# Patient Record
Sex: Male | Born: 1970 | Race: White | Hispanic: No | Marital: Single | Smoking: Former smoker
Health system: Southern US, Community
[De-identification: ages and names within clinical notes are randomized; demographics above are authoritative.]

## PROBLEM LIST (undated history)

## (undated) HISTORY — PX: ANAL FISSURE REPAIR: SHX2312

---

## 2008-09-26 ENCOUNTER — Ambulatory Visit: Payer: Self-pay | Admitting: Family Medicine

## 2016-06-10 ENCOUNTER — Ambulatory Visit
Admission: EM | Admit: 2016-06-10 | Discharge: 2016-06-10 | Disposition: A | Discharge status: Home / Self Care | Payer: 59 | Attending: Family Medicine | Admitting: Family Medicine

## 2016-06-10 ENCOUNTER — Ambulatory Visit (INDEPENDENT_AMBULATORY_CARE_PROVIDER_SITE_OTHER): Payer: 59

## 2016-06-10 ENCOUNTER — Encounter: Payer: Self-pay | Admitting: *Deleted

## 2016-06-10 DIAGNOSIS — S6991XA Unspecified injury of right wrist, hand and finger(s), initial encounter: Secondary | ICD-10-CM

## 2016-06-10 MED ORDER — MELOXICAM 15 MG PO TABS: 15.0000 mg | ORAL_TABLET | Freq: Every day | ORAL | 0 refills | Status: DC

## 2016-06-10 NOTE — ED Provider Notes (Signed)
[facility]    CSN: [id number] Arrival date & time: 06/10/16  1138  First Provider Contact:  First MD Initiated Contact with Patient 06/10/16 1224        History   Chief Complaint Chief Complaint  Patient presents with  . Finger Injury    HPI Ronald Ibarra is a 45 y.o. male.   Patient's here because of injury to his right hand. Reports about 3 weeks ago he was doing some cutting down corn stalks. He states that he was using his right hand thinks he may use his hand and properly start having pain in his right hand. The pain is over the first medical carpal bone. He denies any pain over the phalangeal bones.  He denies any chronic medical medical problems. Denies any chronic medications. Denies any drug allergies. No pertinent family medical history significant for today's visit.   The history is provided by the patient. No language interpreter was used.  Hand Pain  This is a new problem. The current episode started more than 1 week ago. The problem occurs constantly. The problem has not changed since onset.Pertinent negatives include no chest pain, no abdominal pain, no headaches and no shortness of breath. The symptoms are aggravated by bending and twisting. Nothing relieves the symptoms. He has tried nothing for the symptoms. The treatment provided no relief.    History reviewed. No pertinent past medical history.  There are no active problems to display for this patient.   Past Surgical History:  Procedure Laterality Date  . ANAL FISSURE REPAIR         Home Medications    Prior to Admission medications   Medication Sig Start Date End Date Taking? Authorizing Provider  meloxicam (MOBIC) 15 MG tablet Take 1 tablet (15 mg total) by mouth daily. 06/10/16   Hassan RowanEugene Kaled Allende, MD    Family History History reviewed. No pertinent family history.  Social History Social History  Substance Use Topics  . Smoking status: Former Games developermoker  . Smokeless tobacco: Never  Used  . Alcohol use Yes     Allergies   Review of patient's allergies indicates no known allergies.   Review of Systems Review of Systems  Respiratory: Negative for shortness of breath.   Cardiovascular: Negative for chest pain.  Gastrointestinal: Negative for abdominal pain.  Musculoskeletal: Positive for myalgias.  Neurological: Negative for headaches.  All other systems reviewed and are negative.    Physical Exam Triage Vital Signs ED Triage Vitals  Enc Vitals Group     BP 06/10/16 1214 118/80     Pulse Rate 06/10/16 1214 (!) 55     Resp 06/10/16 1214 16     Temp 06/10/16 1214 98.7 F (37.1 C)     Temp Source 06/10/16 1214 Oral     SpO2 06/10/16 1214 99 %     Weight 06/10/16 1215 183 lb (83 kg)     Height 06/10/16 1215 5\' 11"  (1.803 m)     Head Circumference --      Peak Flow --      Pain Score --      Pain Loc --      Pain Edu? --      Excl. in GC? --    No data found.   Updated Vital Signs BP 118/80 (BP Location: Left Arm)   Pulse (!) 55   Temp 98.7 F (37.1 C) (Oral)   Resp 16   Ht 5\' 11"  (1.803 m)   Wt  183 lb (83 kg)   SpO2 99%   BMI 25.52 kg/m   Visual Acuity Right Eye Distance:   Left Eye Distance:   Bilateral Distance:    Right Eye Near:   Left Eye Near:    Bilateral Near:     Physical Exam  Constitutional: He appears well-developed and well-nourished.  HENT:  Head: Normocephalic and atraumatic.  Eyes: Pupils are equal, round, and reactive to light.  Pulmonary/Chest: Effort normal.  Musculoskeletal: He exhibits tenderness. He exhibits no edema or deformity.       Right hand: He exhibits tenderness.       Hands: Patient is tenderness over the second metacarpal bone in his right hand no signs of obvious fracture of the 2nd metacarpel.  Neurological: He is alert.  Skin: Skin is warm.  Psychiatric: He has a normal mood and affect.     UC Treatments / Results  Labs (all labs ordered are listed, but only abnormal results are  displayed) Labs Reviewed - No data to display  EKG  EKG Interpretation None       Radiology Dg Hand Complete Right  Result Date: 06/10/2016 CLINICAL DATA:  Possible injury right index finger 3 weeks ago while working and garden with hyperflexion. Pain is at the MCP joint. EXAM: RIGHT HAND - COMPLETE 3+ VIEW COMPARISON:  None. FINDINGS: There is no evidence of fracture or dislocation. There is no evidence of arthropathy or other focal bone abnormality. Soft tissues are unremarkable. IMPRESSION: Negative. Electronically Signed   By: Kennith Center M.D.   On: 06/10/2016 13:18    Procedures Procedures (including critical care time)  Medications Ordered in UC Medications - No data to display   Initial Impression / Assessment and Plan / UC Course  I have reviewed the triage vital signs and the nursing notes.  Pertinent labs & imaging results that were available during my care of the patient were reviewed by me and considered in my medical decision making (see chart for details).  Clinical Course    This patient on Mobic 15 mg 1 tablet day if x-rays negative as expected since it's been going on for more than 3 weeks would not worry about splinting the finger at this time. Follow-up with PCP if not better.  Final Clinical Impressions(s) / UC Diagnoses   Final diagnoses:  Hand injury, right, initial encounter    New Prescriptions New Prescriptions   MELOXICAM (MOBIC) 15 MG TABLET    Take 1 tablet (15 mg total) by mouth daily.     Hassan Rowan, MD 06/10/16 1326

## 2016-06-10 NOTE — ED Triage Notes (Signed)
Pt hyperextended right index finger while gardening 3 weeks ago. Right index finger mild edema.

## 2017-11-22 IMAGING — CR DG HAND COMPLETE 3+V*R*
3 series · 3 of 3 positions shown · non-contrast
Comparison: None.

CLINICAL DATA: Possible injury right index finger 3 weeks ago while
working and garden with hyperflexion. Pain is at the MCP joint.

EXAM:
RIGHT HAND - COMPLETE 3+ VIEW

[hand ap]
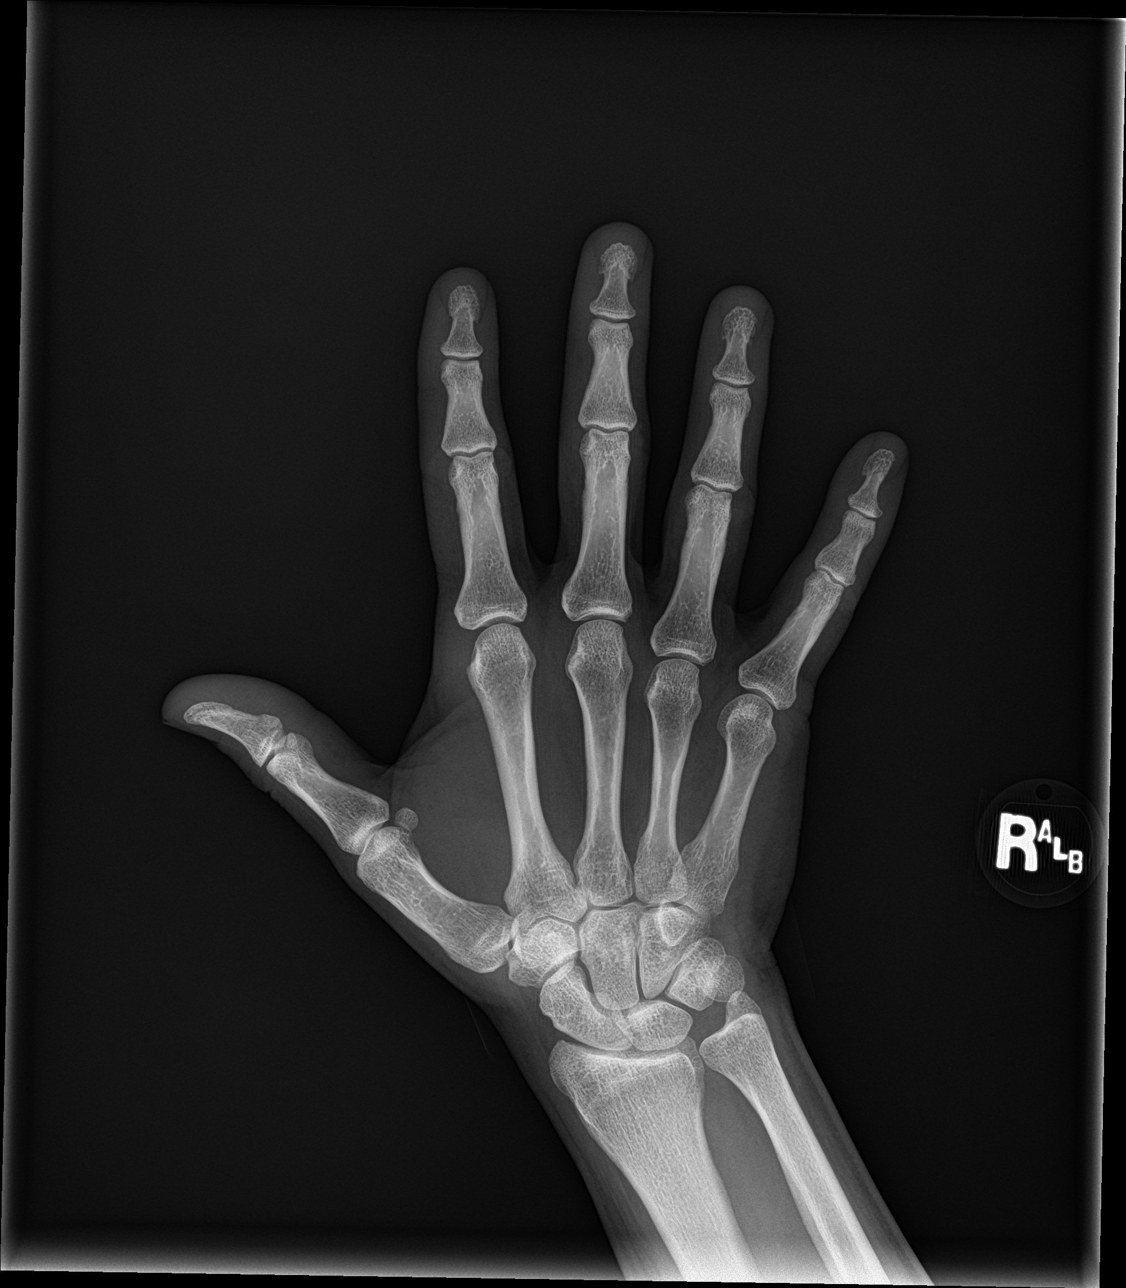

[hand obl]
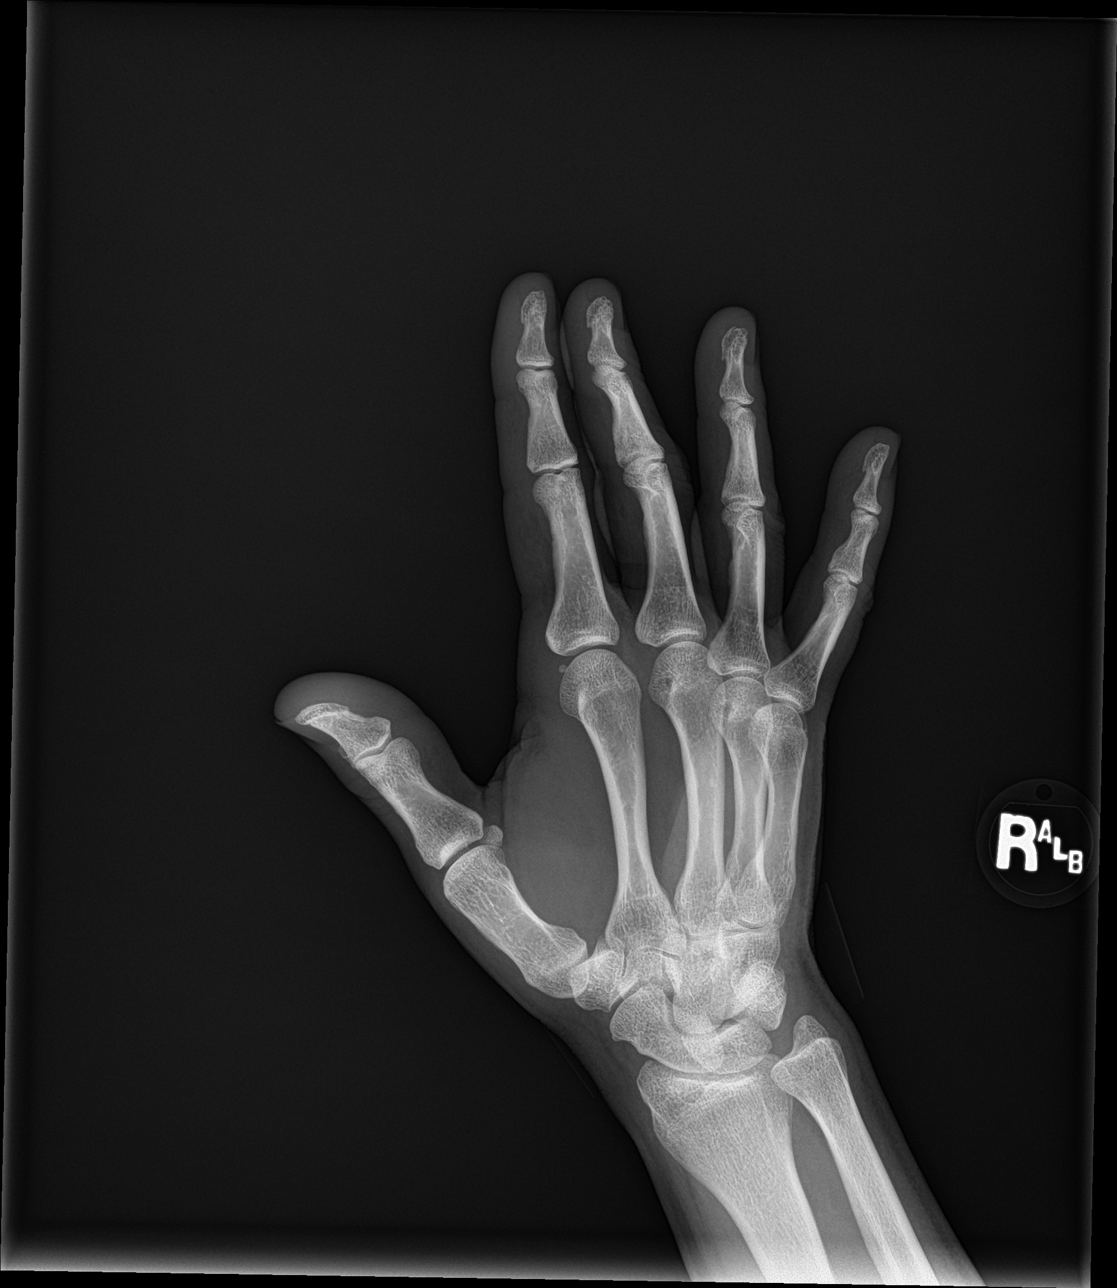

[hand lat]
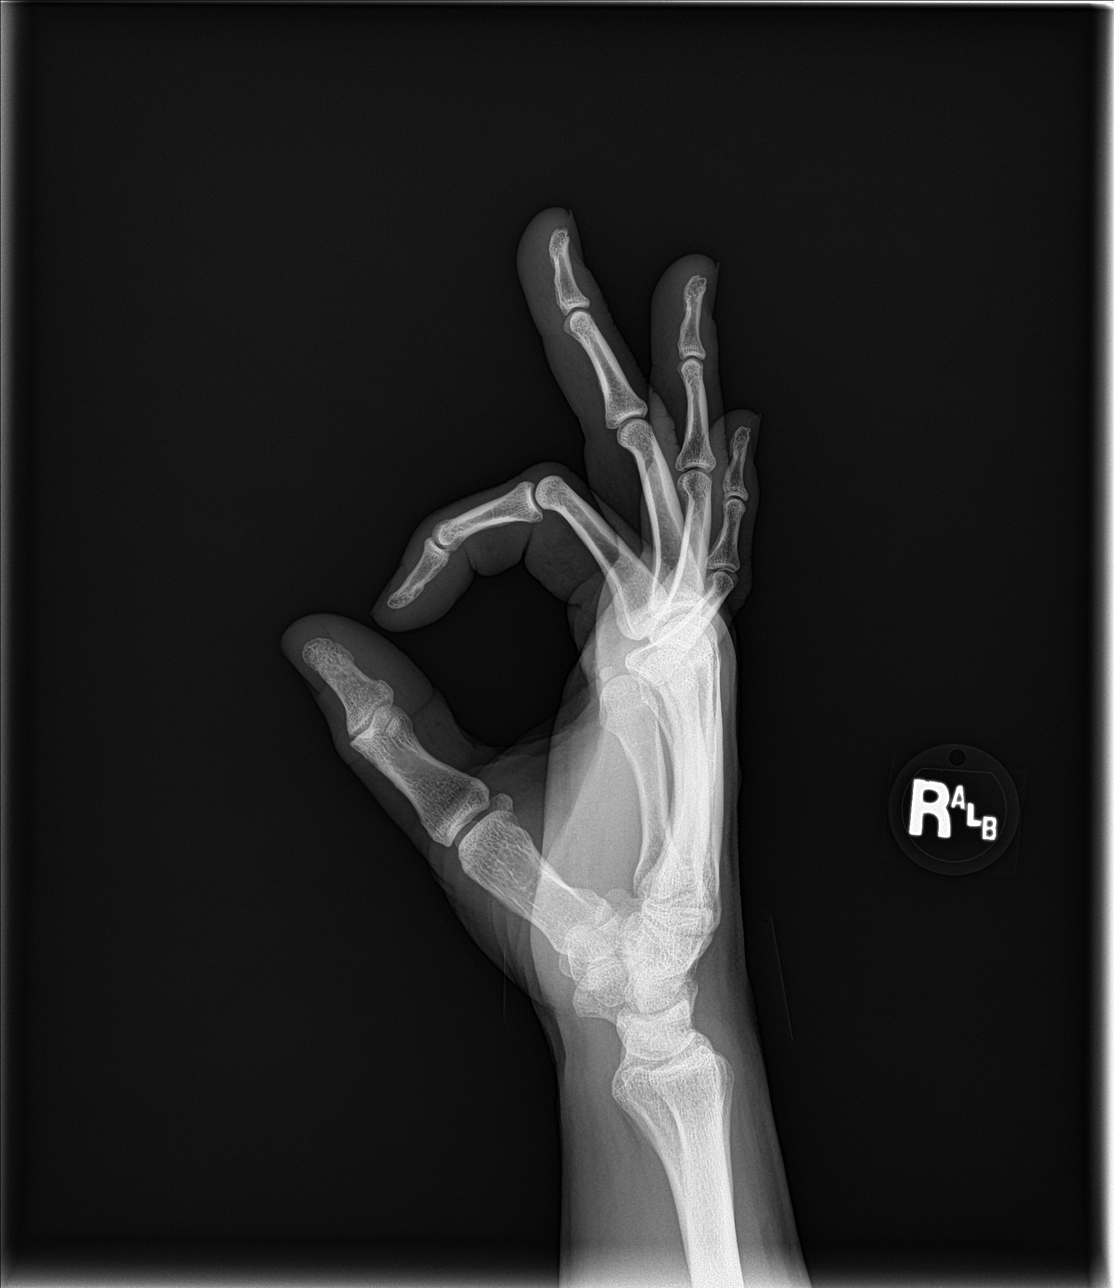

[3 of 3 positions shown; findings below may reference images not displayed]

FINDINGS: There is no evidence of fracture or dislocation. There is no
evidence of arthropathy or other focal bone abnormality. Soft
tissues are unremarkable.
IMPRESSION: Negative.

## 2018-04-01 ENCOUNTER — Ambulatory Visit
Admission: EM | Admit: 2018-04-01 | Discharge: 2018-04-01 | Disposition: A | Discharge status: Home / Self Care | Payer: 59 | Attending: Family Medicine | Admitting: Family Medicine

## 2018-04-01 DIAGNOSIS — J029 Acute pharyngitis, unspecified: Secondary | ICD-10-CM

## 2018-04-01 DIAGNOSIS — B9789 Other viral agents as the cause of diseases classified elsewhere: Secondary | ICD-10-CM

## 2018-04-01 LAB — RAPID STREP SCREEN (MED CTR MEBANE ONLY): STREPTOCOCCUS, GROUP A SCREEN (DIRECT): NEGATIVE

## 2018-04-01 MED ORDER — LIDOCAINE VISCOUS HCL 2 % MT SOLN: OROMUCOSAL | 0 refills | Status: AC

## 2018-04-01 NOTE — ED Triage Notes (Signed)
As per patient pain started on week ago as sore throat but now feels deeper discomfort in throat in Right side.

## 2018-04-01 NOTE — ED Provider Notes (Signed)
[facility]    CSN: [id number] Arrival date & time: 04/01/18  1133  History   Chief Complaint Chief Complaint  Patient presents with  . Sore Throat   HPI  47 year old male presents with sore throat.  Patient reports that he has had sore throat since Friday.  Patient states that it is difficult when he swallows.  He localizes the pain to the right side.  No fever or chills.  No medications or interventions tried.  No known relieving factors.  No other upper respiratory symptoms.  No other complaints.  Social History Social History   Tobacco Use  . Smoking status: Former Games developermoker  . Smokeless tobacco: Never Used  Substance Use Topics  . Alcohol use: Yes  . Drug use: Not on file    Allergies   Patient has no known allergies.   Review of Systems Review of Systems  Constitutional: Negative for fever.  HENT: Positive for sore throat.    Physical Exam Triage Vital Signs ED Triage Vitals  Enc Vitals Group     BP 04/01/18 1147 119/80     Pulse Rate 04/01/18 1147 60     Resp 04/01/18 1147 16     Temp 04/01/18 1147 98.2 F (36.8 C)     Temp Source 04/01/18 1147 Oral     SpO2 04/01/18 1147 100 %     Weight 04/01/18 1145 195 lb (88.5 kg)     Height 04/01/18 1145 5\' 10"  (1.778 m)     Head Circumference --      Peak Flow --      Pain Score --      Pain Loc --      Pain Edu? --      Excl. in GC? --    Updated Vital Signs BP 119/80 (BP Location: Left Arm)   Pulse 60   Temp 98.2 F (36.8 C) (Oral)   Resp 16   Ht 5\' 10"  (1.778 m)   Wt 195 lb (88.5 kg)   SpO2 100%   BMI 27.98 kg/m   Physical Exam  Constitutional: He is oriented to person, place, and time. He appears well-developed. No distress.  HENT:  Head: Normocephalic and atraumatic.  Oropharynx with mild to moderate erythema.  No exudate.  Neck: Neck supple.  Cardiovascular: Normal rate and regular rhythm.  Pulmonary/Chest: Effort normal and breath sounds normal. He has no wheezes. He has no  rales.  Lymphadenopathy:    He has no cervical adenopathy.  Neurological: He is alert and oriented to person, place, and time.  Psychiatric: He has a normal mood and affect. His behavior is normal.  Nursing note and vitals reviewed.  UC Treatments / Results  Labs (all labs ordered are listed, but only abnormal results are displayed) Labs Reviewed  RAPID STREP SCREEN ([facility] ONLY)  CULTURE, GROUP A STREP [facility])    EKG None  Radiology No results found.  Procedures Procedures (including critical care time)  Medications Ordered in UC Medications - No data to display  Initial Impression / Assessment and Plan / UC Course  I have reviewed the triage vital signs and the nursing notes.  Pertinent labs & imaging results that were available during my care of the patient were reviewed by me and considered in my medical decision making (see chart for details).    47 year old male presents with viral pharyngitis.  Ibuprofen and viscous lidocaine as needed.  Supportive care.  Final Clinical Impressions(s) / UC  Diagnoses   Final diagnoses:  Viral pharyngitis     Discharge Instructions     Fluids.  Ibuprofen as needed.  Use the prescription if needed.  Take care  Dr. Adriana Simas     ED Prescriptions    Medication Sig Dispense Auth. Provider   lidocaine (XYLOCAINE) 2 % solution Gargle 15 mL every 3 hours as needed for sore throat. May swallow if desired. 200 mL Tommie Sams, DO     Controlled Substance Prescriptions [place] Controlled Substance Registry consulted? Not Applicable   Tommie Sams, DO 04/01/18 1226

## 2018-04-01 NOTE — Discharge Instructions (Signed)
Fluids.  Ibuprofen as needed.  Use the prescription if needed.  Take care  Dr. Adriana Simasook

## 2018-04-04 LAB — CULTURE, GROUP A STREP (THRC)
